# Patient Record
Sex: Male | Born: 1965 | Race: White | Hispanic: No | Smoking: Never smoker
Health system: Southern US, Community
[De-identification: ages and names within clinical notes are randomized; demographics above are authoritative.]

## PROBLEM LIST (undated history)

## (undated) DIAGNOSIS — G4730 Sleep apnea, unspecified: Secondary | ICD-10-CM

## (undated) DIAGNOSIS — E669 Obesity, unspecified: Secondary | ICD-10-CM

## (undated) DIAGNOSIS — I10 Essential (primary) hypertension: Secondary | ICD-10-CM

## (undated) HISTORY — DX: Essential (primary) hypertension: I10

## (undated) HISTORY — DX: Sleep apnea, unspecified: G47.30

## (undated) HISTORY — PX: VASECTOMY: SHX75

## (undated) HISTORY — DX: Obesity, unspecified: E66.9

---

## 2001-06-05 ENCOUNTER — Other Ambulatory Visit: Admission: RE | Admit: 2001-06-05 | Discharge: 2001-06-05 | Payer: Self-pay | Admitting: Urology

## 2004-03-06 ENCOUNTER — Ambulatory Visit (HOSPITAL_COMMUNITY): Admission: RE | Admit: 2004-03-06 | Discharge: 2004-03-07 | Payer: Self-pay | Admitting: Otolaryngology

## 2004-03-06 ENCOUNTER — Encounter (INDEPENDENT_AMBULATORY_CARE_PROVIDER_SITE_OTHER): Payer: Self-pay | Admitting: Specialist

## 2008-11-19 ENCOUNTER — Encounter: Admission: RE | Admit: 2008-11-19 | Discharge: 2008-11-19 | Payer: Self-pay | Admitting: Family Medicine

## 2010-12-11 NOTE — Op Note (Signed)
NAME:  CONNOR, MEACHAM NO.:  [id number]   MEDICAL RECORD NO.:  [id number]                   PATIENT TYPE:  OIB   LOCATION:  2112                                 FACILITY:  [facility]   PHYSICIAN:  Suzanna Obey, M.D.                    DATE OF BIRTH:  [birth year]   DATE OF PROCEDURE:  03/06/2004  DATE OF DISCHARGE:  03/07/2004                                 OPERATIVE REPORT   PREOPERATIVE DIAGNOSIS:  Obstructive sleep apnea, deviated septum, turbinate  hypertrophy.   POSTOPERATIVE DIAGNOSIS:  Obstructive sleep apnea, deviated septum,  turbinate hypertrophy.   OPERATION PERFORMED:  Septoplasty, submucous resection of inferior  turbinates, uvulopalatopharyngoplasty and tonsillectomy.   SURGEON:  Suzanna Obey, M.D.   ANESTHESIA:  General endotracheal tube.   ESTIMATED BLOOD LOSS:  Approximately 25 mL.   INDICATIONS FOR PROCEDURE:  The patient is a 45 year old who has had  problems with obstructive sleep apnea, has not been able to tolerate CPAP.  He has chronic nasal obstruction and deviated septum.  He also had chronic  tonsil problems.  He was informed of the risks and benefits of the procedure  including bleeding, infection, velopharyngeal insufficiency, change in the  voice, chronic crusting and drying, septal perforation, change in the  external appearance of the nose and the risks of the anesthetic.  All  questions were answered and consent was obtained.   DESCRIPTION OF PROCEDURE:  The patient was taken to the operating room and  placed in supine position.  After adequate general endotracheal tube  anesthesia, he was prepped and draped in the usual sterile manner.  The  oxymetazoline pledgets were placed into the nose bilaterally and then septum  and inferior turbinates were injected with 1%  lidocaine with 1:100,000  epinephrine.  The hemitransfixion incision on the right was made, raising  the mucoperichondrial and mucoperiosteal flap.  The  cartilage was divided  about 1.5 cm posterior to the caudal strut.  This cartilage was removed.  The posterior bone was removed with a Jansen-Middleton forceps.  The piece  of cartilage was then marsupialized and placed back in the flap and the flap  was closed with interrupted 4-0 chromic.  A 4-0 plain gut quilting stitch  was placed through the septum to secure the flaps as well as the cartilage.  The inferior turbinates were infractured.  A midline incision was made with  a 15 blade.  Mucosal flap elevated superiorly.  The inferior mucosa and bone  were removed with turbinate scissors.  The edge was cauterized with suction  cautery.  Both flaps were laid back down over the raw surface and turbinates  were outfractured.  Telfa rolls soaked in bacitracin placed in the nose  bilaterally and then secured with 3-0 nylon.  The Crowe-Davis mouth gag was  then inserted, retracted and suspended from the Mayo stand.  The operation  was begun making a left anterior tonsillar  pillar incision identifying the  capsule of the tonsil and removing it with electrocautery dissection.  The  dissection was carried across the palate right at the base of the uvula  crossing to the right tonsillar fossa where the tonsil was removed with  electrocautery dissection along its capsule.  The tonsils and uvula were  removed en bloc  and then the suction cautery was used to obtain hemostasis.  The palate and  tonsillar fossas were closed with interrupted 3-0 Vicryl.  There was good  hemostasis.  The hypopharynx, esophagus and stomach were suctioned with the  NG tube.  The patient was then awakened, brought to recovery in stable  condition. Counts correct.                                               Suzanna Obey, M.D.    Cordelia Pen  D:  04/02/2004  T:  04/02/2004  Job:  [id number]

## 2011-02-01 ENCOUNTER — Encounter: Payer: Self-pay | Admitting: Family Medicine

## 2011-02-01 DIAGNOSIS — I10 Essential (primary) hypertension: Secondary | ICD-10-CM | POA: Insufficient documentation

## 2011-02-01 DIAGNOSIS — G4730 Sleep apnea, unspecified: Secondary | ICD-10-CM | POA: Insufficient documentation

## 2011-02-01 DIAGNOSIS — E669 Obesity, unspecified: Secondary | ICD-10-CM | POA: Insufficient documentation

## 2014-12-27 DIAGNOSIS — N200 Calculus of kidney: Secondary | ICD-10-CM | POA: Diagnosis not present

## 2014-12-27 DIAGNOSIS — I10 Essential (primary) hypertension: Secondary | ICD-10-CM | POA: Insufficient documentation

## 2014-12-27 DIAGNOSIS — Z79899 Other long term (current) drug therapy: Secondary | ICD-10-CM | POA: Insufficient documentation

## 2014-12-27 DIAGNOSIS — R109 Unspecified abdominal pain: Secondary | ICD-10-CM | POA: Diagnosis present

## 2014-12-27 DIAGNOSIS — K4020 Bilateral inguinal hernia, without obstruction or gangrene, not specified as recurrent: Secondary | ICD-10-CM | POA: Diagnosis not present

## 2014-12-28 ENCOUNTER — Emergency Department
Admission: EM | Admit: 2014-12-28 | Discharge: 2014-12-28 | Disposition: A | Discharge status: Home / Self Care | Payer: BLUE CROSS/BLUE SHIELD | Attending: Emergency Medicine | Admitting: Emergency Medicine

## 2014-12-28 ENCOUNTER — Encounter: Payer: Self-pay | Admitting: Emergency Medicine

## 2014-12-28 ENCOUNTER — Emergency Department: Payer: BLUE CROSS/BLUE SHIELD

## 2014-12-28 DIAGNOSIS — N200 Calculus of kidney: Secondary | ICD-10-CM

## 2014-12-28 DIAGNOSIS — K4020 Bilateral inguinal hernia, without obstruction or gangrene, not specified as recurrent: Secondary | ICD-10-CM

## 2014-12-28 LAB — URINALYSIS COMPLETE WITH MICROSCOPIC (ARMC ONLY)
Bacteria, UA: NONE SEEN
Bilirubin Urine: NEGATIVE
GLUCOSE, UA: NEGATIVE mg/dL
KETONES UR: NEGATIVE mg/dL
Leukocytes, UA: NEGATIVE
NITRITE: NEGATIVE
Protein, ur: NEGATIVE mg/dL
Specific Gravity, Urine: 1.018 (ref 1.005–1.030)
pH: 6 (ref 5.0–8.0)

## 2014-12-28 LAB — CBC WITH DIFFERENTIAL/PLATELET
BASOS PCT: 1 %
Basophils Absolute: 0.1 10*3/uL (ref 0–0.1)
EOS ABS: 0.1 10*3/uL (ref 0–0.7)
EOS PCT: 1 %
HEMATOCRIT: 44.7 % (ref 40.0–52.0)
Hemoglobin: 14.5 g/dL (ref 13.0–18.0)
LYMPHS PCT: 20 %
Lymphs Abs: 1.9 10*3/uL (ref 1.0–3.6)
MCH: 29.3 pg (ref 26.0–34.0)
MCHC: 32.5 g/dL (ref 32.0–36.0)
MCV: 90.4 fL (ref 80.0–100.0)
MONOS PCT: 9 %
Monocytes Absolute: 0.8 10*3/uL (ref 0.2–1.0)
NEUTROS ABS: 6.4 10*3/uL (ref 1.4–6.5)
NEUTROS PCT: 69 %
PLATELETS: 297 10*3/uL (ref 150–440)
RBC: 4.94 MIL/uL (ref 4.40–5.90)
RDW: 13.6 % (ref 11.5–14.5)
WBC: 9.3 10*3/uL (ref 3.8–10.6)

## 2014-12-28 LAB — COMPREHENSIVE METABOLIC PANEL
ALBUMIN: 3.9 g/dL (ref 3.5–5.0)
ALK PHOS: 76 U/L (ref 38–126)
ALT: 26 U/L (ref 17–63)
AST: 25 U/L (ref 15–41)
Anion gap: 9 (ref 5–15)
BUN: 20 mg/dL (ref 6–20)
CO2: 25 mmol/L (ref 22–32)
CREATININE: 1.19 mg/dL (ref 0.61–1.24)
Calcium: 9.1 mg/dL (ref 8.9–10.3)
Chloride: 107 mmol/L (ref 101–111)
GFR calc non Af Amer: >60 mL/min (ref >60)
GLUCOSE: 155 mg/dL — AB (ref 65–99)
POTASSIUM: 3.7 mmol/L (ref 3.5–5.1)
Sodium: 141 mmol/L (ref 135–145)
Total Bilirubin: 0.6 mg/dL (ref 0.3–1.2)
Total Protein: 7.5 g/dL (ref 6.5–8.1)

## 2014-12-28 LAB — TROPONIN I: Troponin I: <0.03 ng/mL (ref <0.031)

## 2014-12-28 MED ORDER — IBUPROFEN 400 MG PO TABS
400.0000 mg | ORAL_TABLET | Freq: Once | ORAL | Status: AC
  Administered 2014-12-28: 400 mg via ORAL

## 2014-12-28 MED ORDER — IBUPROFEN 400 MG PO TABS
ORAL_TABLET | ORAL | Status: AC
  Filled 2014-12-28: qty 1

## 2014-12-28 MED ORDER — ONDANSETRON 8 MG PO TBDP
8.0000 mg | ORAL_TABLET | Freq: Once | ORAL | Status: AC
  Administered 2014-12-28: 8 mg via ORAL

## 2014-12-28 MED ORDER — OXYCODONE-ACETAMINOPHEN 5-325 MG PO TABS: 1.0000 | ORAL_TABLET | ORAL | Status: DC | PRN

## 2014-12-28 MED ORDER — MORPHINE SULFATE 4 MG/ML IJ SOLN
4.0000 mg | Freq: Once | INTRAMUSCULAR | Status: AC
  Administered 2014-12-28: 4 mg via INTRAVENOUS

## 2014-12-28 MED ORDER — MORPHINE SULFATE 4 MG/ML IJ SOLN
INTRAMUSCULAR | Status: AC
  Administered 2014-12-28: 4 mg via INTRAVENOUS
  Filled 2014-12-28: qty 1

## 2014-12-28 MED ORDER — ONDANSETRON HCL 4 MG/2ML IJ SOLN
INTRAMUSCULAR | Status: AC
  Administered 2014-12-28: 4 mg via INTRAVENOUS
  Filled 2014-12-28: qty 2

## 2014-12-28 MED ORDER — ONDANSETRON HCL 4 MG/2ML IJ SOLN
4.0000 mg | Freq: Once | INTRAMUSCULAR | Status: AC
  Administered 2014-12-28: 4 mg via INTRAVENOUS

## 2014-12-28 MED ORDER — ONDANSETRON 8 MG PO TBDP
ORAL_TABLET | ORAL | Status: AC
  Filled 2014-12-28: qty 1

## 2014-12-28 NOTE — ED Notes (Signed)
Patient with complaint of right flank pain, pain with urination, urinary frequency, vomiting and diarrhea.

## 2014-12-28 NOTE — Discharge Instructions (Signed)
Inguinal Hernia, Adult Muscles help keep everything in the body in its proper place. But if a weak spot in the muscles develops, something can poke through. That is called a hernia. When this happens in the lower part of the belly (abdomen), it is called an inguinal hernia. (It takes its name from a part of the body in this region called the inguinal canal.) A weak spot in the wall of muscles lets some fat or part of the small intestine bulge through. An inguinal hernia can develop at any age. Men get them more often than women. CAUSES  In adults, an inguinal hernia develops over time.  It can be triggered by:  Suddenly straining the muscles of the lower abdomen.  Lifting heavy objects.  Straining to have a bowel movement. Difficult bowel movements (constipation) can lead to this.  Constant coughing. This may be caused by smoking or lung disease.  Being overweight.  Being pregnant.  Working at a job that requires long periods of standing or heavy lifting.  Having had an inguinal hernia before. One type can be an emergency situation. It is called a strangulated inguinal hernia. It develops if part of the small intestine slips through the weak spot and cannot get back into the abdomen. The blood supply can be cut off. If that happens, part of the intestine may die. This situation requires emergency surgery. SYMPTOMS  Often, a small inguinal hernia has no symptoms. It is found when a healthcare provider does a physical exam. Larger hernias usually have symptoms.   In adults, symptoms may include:  A lump in the groin. This is easier to see when the person is standing. It might disappear when lying down.  In men, a lump in the scrotum.  Pain or burning in the groin. This occurs especially when lifting, straining or coughing.  A dull ache or feeling of pressure in the groin.  Signs of a strangulated hernia can include:  A bulge in the groin that becomes very painful and tender to the  touch.  A bulge that turns red or purple.  Fever, nausea and vomiting.  Inability to have a bowel movement or to pass gas. DIAGNOSIS  To decide if you have an inguinal hernia, a healthcare provider will probably do a physical examination.  This will include asking questions about any symptoms you have noticed.  The healthcare provider might feel the groin area and ask you to cough. If an inguinal hernia is felt, the healthcare provider may try to slide it back into the abdomen.  Usually no other tests are needed. TREATMENT  Treatments can vary. The size of the hernia makes a difference. Options include:  Watchful waiting. This is often suggested if the hernia is small and you have had no symptoms.  No medical procedure will be done unless symptoms develop.  You will need to watch closely for symptoms. If any occur, contact your healthcare provider right away.  Surgery. This is used if the hernia is larger or you have symptoms.  Open surgery. This is usually an outpatient procedure (you will not stay overnight in a hospital). An cut (incision) is made through the skin in the groin. The hernia is put back inside the abdomen. The weak area in the muscles is then repaired by herniorrhaphy or hernioplasty. Herniorrhaphy: in this type of surgery, the weak muscles are sewn back together. Hernioplasty: a patch or mesh is used to close the weak area in the abdominal wall.  Laparoscopy.  In this procedure, a surgeon makes small incisions. A thin tube with a tiny video camera (called a laparoscope) is put into the abdomen. The surgeon repairs the hernia with mesh by looking with the video camera and using two long instruments. HOME CARE INSTRUCTIONS   After surgery to repair an inguinal hernia:  You will need to take pain medicine prescribed by your healthcare provider. Follow all directions carefully.  You will need to take care of the wound from the incision.  Your activity will be  restricted for awhile. This will probably include no heavy lifting for several weeks. You also should not do anything too active for a few weeks. When you can return to work will depend on the type of job that you have.  During "watchful waiting" periods, you should:  Maintain a healthy weight.  Eat a diet high in fiber (fruits, vegetables and whole grains).  Drink plenty of fluids to avoid constipation. This means drinking enough water and other liquids to keep your urine clear or pale yellow.  Do not lift heavy objects.  Do not stand for long periods of time.  Quit smoking. This should keep you from developing a frequent cough. SEEK MEDICAL CARE IF:   A bulge develops in your groin area.  You feel pain, a burning sensation or pressure in the groin. This might be worse if you are lifting or straining.  You develop a fever of more than 100.5 F (38.1 C). SEEK IMMEDIATE MEDICAL CARE IF:   Pain in the groin increases suddenly.  A bulge in the groin gets bigger suddenly and does not go down.  For men, there is sudden pain in the scrotum. Or, the size of the scrotum increases.  A bulge in the groin area becomes red or purple and is painful to touch.  You have nausea or vomiting that does not go away.  You feel your heart beating much faster than normal.  You cannot have a bowel movement or pass gas.  You develop a fever of more than 102.0 F (38.9 C). Document Released: 11/28/2008 Document Revised: 10/04/2011 Document Reviewed: 11/28/2008 [facility] Patient Information 2015 [place], [place]. This information is not intended to replace advice given to you by your health care provider. Make sure you discuss any questions you have with your health care provider.  Kidney Stones Kidney stones (urolithiasis) are deposits that form inside your kidneys. The intense pain is caused by the stone moving through the urinary tract. When the stone moves, the ureter goes into spasm around the  stone. The stone is usually passed in the urine.  CAUSES   A disorder that makes certain neck glands produce too much parathyroid hormone (primary hyperparathyroidism).  A buildup of uric acid crystals, similar to gout in your joints.  Narrowing (stricture) of the ureter.  A kidney obstruction present at birth (congenital obstruction).  Previous surgery on the kidney or ureters.  Numerous kidney infections. SYMPTOMS   Feeling sick to your stomach (nauseous).  Throwing up (vomiting).  Blood in the urine (hematuria).  Pain that usually spreads (radiates) to the groin.  Frequency or urgency of urination. DIAGNOSIS   Taking a history and physical exam.  Blood or urine tests.  CT scan.  Occasionally, an examination of the inside of the urinary bladder (cystoscopy) is performed. TREATMENT   Observation.  Increasing your fluid intake.  Extracorporeal shock wave lithotripsy--This is a noninvasive procedure that uses shock waves to break up kidney stones.  Surgery may  be needed if you have severe pain or persistent obstruction. There are various surgical procedures. Most of the procedures are performed with the use of small instruments. Only small incisions are needed to accommodate these instruments, so recovery time is minimized. The size, location, and chemical composition are all important variables that will determine the proper choice of action for you. Talk to your health care provider to better understand your situation so that you will minimize the risk of injury to yourself and your kidney.  HOME CARE INSTRUCTIONS   Drink enough water and fluids to keep your urine clear or pale yellow. This will help you to pass the stone or stone fragments.  Strain all urine through the provided strainer. Keep all particulate matter and stones for your health care provider to see. The stone causing the pain may be as small as a grain of salt. It is very important to use the strainer  each and every time you pass your urine. The collection of your stone will allow your health care provider to analyze it and verify that a stone has actually passed. The stone analysis will often identify what you can do to reduce the incidence of recurrences.  Only take over-the-counter or prescription medicines for pain, discomfort, or fever as directed by your health care provider.  Make a follow-up appointment with your health care provider as directed.  Get follow-up X-rays if required. The absence of pain does not always mean that the stone has passed. It may have only stopped moving. If the urine remains completely obstructed, it can cause loss of kidney function or even complete destruction of the kidney. It is your responsibility to make sure X-rays and follow-ups are completed. Ultrasounds of the kidney can show blockages and the status of the kidney. Ultrasounds are not associated with any radiation and can be performed easily in a matter of minutes. SEEK MEDICAL CARE IF:  You experience pain that is progressive and unresponsive to any pain medicine you have been prescribed. SEEK IMMEDIATE MEDICAL CARE IF:   Pain cannot be controlled with the prescribed medicine.  You have a fever or shaking chills.  The severity or intensity of pain increases over 18 hours and is not relieved by pain medicine.  You develop a new onset of abdominal pain.  You feel faint or pass out.  You are unable to urinate. MAKE SURE YOU:   Understand these instructions.  Will watch your condition.  Will get help right away if you are not doing well or get worse. Document Released: 07/12/2005 Document Revised: 03/14/2013 Document Reviewed: 12/13/2012 [facility] Patient Information 2015 [place], [place]. This information is not intended to replace advice given to you by your health care provider. Make sure you discuss any questions you have with your health care provider.

## 2014-12-28 NOTE — ED Provider Notes (Signed)
[facility] Emergency [facility] Provider Note  ____________________________________________  Time seen: 4:00 AM  I have reviewed the triage vital signs and the nursing notes.   HISTORY  Chief Complaint Flank Pain      HPI Rodney Rios is a 49 y.o. male presents with acute onset of right flank pain times one day.Current pain score 10 out of 10 described as sharp. Patient also admits to urinary frequency and vomiting. Patient denies any fever no diarrhea    Past Medical History  Diagnosis Date  . Sleep apnea   . Obesity   . Hypertension     Patient Active Problem List   Diagnosis Date Noted  . Sleep apnea   . Obesity   . Hypertension     Past Surgical History  Procedure Laterality Date  . Vasectomy      Current Outpatient Rx  Name  Route  Sig  Dispense  Refill  . benazepril-hydrochlorthiazide (LOTENSIN HCT) 10-12.5 MG per tablet   Oral   Take 1 tablet by mouth daily.             Allergies Review of patient's allergies indicates no known allergies.  History reviewed. No pertinent family history.  Social History History  Substance Use Topics  . Smoking status: Never Smoker   . Smokeless tobacco: Not on file  . Alcohol Use: No    Review of Systems  Constitutional: Negative for fever. Eyes: Negative for visual changes. ENT: Negative for sore throat. Cardiovascular: Negative for chest pain. Respiratory: Negative for shortness of breath. Gastrointestinal: Positive right flank pain vomiting. Genitourinary: Negative for dysuria. Musculoskeletal: Negative for back pain. Skin: Negative for rash. Neurological: Negative for headaches, focal weakness or numbness.   10-point ROS otherwise negative.  ____________________________________________   PHYSICAL EXAM:  VITAL SIGNS: ED Triage Vitals  Enc Vitals Group     BP 12/28/14 0030 156/81 mmHg     Pulse Rate 12/28/14 0030 62     Resp 12/28/14 0030 22     Temp  12/28/14 0030 98.1 F (36.7 C)     Temp Source 12/28/14 0030 Oral     SpO2 12/28/14 0030 97 %     Weight 12/28/14 0030 375 lb (170.099 kg)     Height 12/28/14 0030 5\' 9"  (1.753 m)     Head Cir --      Peak Flow --      Pain Score 12/28/14 0030 9     Pain Loc --      Pain Edu? --      Excl. in GC? --     Constitutional: Alert and oriented. Apparent distress Eyes: Conjunctivae are normal. PERRL. Normal extraocular movements. ENT   Head: Normocephalic and atraumatic.   Nose: No congestion/rhinnorhea.   Mouth/Throat: Mucous membranes are moist.   Neck: No stridor. Cardiovascular: Normal rate, regular rhythm. Normal and symmetric distal pulses are present in all extremities. No murmurs, rubs, or gallops. Respiratory: Normal respiratory effort without tachypnea nor retractions. Breath sounds are clear and equal bilaterally. No wheezes/rales/rhonchi. Gastrointestinal: Soft and nontender. No distention. There is no CVA tenderness. Genitourinary: deferred Musculoskeletal: Nontender with normal range of motion in all extremities. No joint effusions.  No lower extremity tenderness nor edema. Neurologic:  Normal speech and language. No gross focal neurologic deficits are appreciated. Speech is normal.  Skin:  Skin is warm, dry and intact. No rash noted. Psychiatric: Mood and affect are normal. Speech and behavior are normal. Patient exhibits appropriate insight and  judgment.  ____________________________________________    LABS (pertinent positives/negatives)  Labs Reviewed  COMPREHENSIVE METABOLIC PANEL - Abnormal; Notable for the following:    Glucose, Bld 155 (*)    All other components within normal limits  URINALYSIS COMPLETEWITH MICROSCOPIC ([facility] ONLY) - Abnormal; Notable for the following:    Color, Urine YELLOW (*)    APPearance CLEAR (*)    Hgb urine dipstick 2+ (*)    Squamous Epithelial / LPF 0-5 (*)    All other components within normal limits  CBC WITH  DIFFERENTIAL/PLATELET  TROPONIN I     ____________________________________________      RADIOLOGY  CT abdomen and pelvis revealed bilateral inguinal hernias containing fat.  ____________________________________________      INITIAL IMPRESSION / ASSESSMENT AND PLAN / ED COURSE  Pertinent labs & imaging results that were available during my care of the patient were reviewed by me and considered in my medical decision making (see chart for details).  History of physical exam consistent with possible ureterolithiasis given hematuria and questionable area on the CT for ureterolithiasis. Patient received IV morphine 2 mg and Zofran emergency department.  ____________________________________________   FINAL CLINICAL IMPRESSION(S) / ED DIAGNOSES  Final diagnoses:  Bilateral inguinal hernia without obstruction or gangrene, recurrence not specified  Kidney stone      Darci Current, MD 12/28/14 [phone]

## 2016-02-25 ENCOUNTER — Ambulatory Visit (INDEPENDENT_AMBULATORY_CARE_PROVIDER_SITE_OTHER): Payer: BLUE CROSS/BLUE SHIELD | Admitting: Neurology

## 2016-02-25 ENCOUNTER — Encounter: Payer: Self-pay | Admitting: Neurology

## 2016-02-25 VITALS — BP 168/96 | HR 88 | Resp 20 | Ht 69.0 in | Wt 396.0 lb

## 2016-02-25 DIAGNOSIS — Z9889 Other specified postprocedural states: Secondary | ICD-10-CM

## 2016-02-25 DIAGNOSIS — G4726 Circadian rhythm sleep disorder, shift work type: Secondary | ICD-10-CM | POA: Diagnosis not present

## 2016-02-25 DIAGNOSIS — E669 Obesity, unspecified: Secondary | ICD-10-CM | POA: Insufficient documentation

## 2016-02-25 DIAGNOSIS — Z9989 Dependence on other enabling machines and devices: Secondary | ICD-10-CM

## 2016-02-25 DIAGNOSIS — G4733 Obstructive sleep apnea (adult) (pediatric): Secondary | ICD-10-CM | POA: Diagnosis not present

## 2016-02-25 DIAGNOSIS — R0683 Snoring: Secondary | ICD-10-CM

## 2016-02-25 DIAGNOSIS — E662 Morbid (severe) obesity with alveolar hypoventilation: Secondary | ICD-10-CM | POA: Diagnosis not present

## 2016-02-25 NOTE — Patient Instructions (Signed)
Bariatric Surgery, Care After  Refer to this sheet in the next few weeks. These instructions provide you with information on caring for yourself after your procedure. Your health care provider may also give you more specific instructions. Your treatment has been planned according to current medical practices, but problems sometimes occur. Call your health care provider if you have any problems or questions after your procedure.  WHAT TO EXPECT AFTER THE PROCEDURE  After your procedure, it is typical to have the following sensations:  · Soreness.  · Sluggishness.  · Tiredness.  · Moodiness.  · Chilliness.  It is not unusual to have dry skin and some hair loss after bariatric surgery.  HOME CARE INSTRUCTIONS  · Do not drink alcohol, use public transportation, or sign important papers for at least 1 day following surgery.  · Do not resume physical activities or drive until directed by your surgeon.  · Avoid lifting anything over 10 pounds (4.5 kg) for 6 weeks following surgery, or until approved by your surgeon.    · Only take over-the-counter or prescription medicines for pain, discomfort, or fever as directed by your surgeon.    · Resume your diet as directed by your surgeon.  You will resume eating with liquids and pureed foods, then soft foods, and progress to a more normal diet over time. Follow your surgeon or dietitian's guidelines for what and how much to eat and drink. You will need to eat slowly, so as not to cause discomfort and vomiting.  · Use showers for bathing as directed by your surgeon.    · Change dressings as directed by your surgeon.  · Schedule a follow-up appointment with your surgeon as directed.  SEEK MEDICAL CARE IF:   · There is redness, swelling, or increasing pain in the wound.    · There is pus coming from the wound.    · There is drainage from the wound lasting longer than 1 day.    · An unexplained oral temperature above 102°F (38.9°C) develops.    · You notice a foul smell coming from  the wound or dressing.    · There is a breaking open of the wound (edges not staying together) after stitches have been removed.    · You notice increasing pain in the shoulder-strap areas.    · You develop episodes of dizziness or faint while standing.    · You develop shortness of breath.    · You develop persistent nausea or vomiting.    · Your soreness seems to be getting worse rather than better.    SEEK IMMEDIATE MEDICAL CARE IF:   · You develop a rash.    · You have difficulty breathing.    · You develop, or feel you are developing, any reaction or side effects to medicines.  FOR MORE INFORMATION  American Society for Bariatric Surgery: www.asbs.org  Weight-control Information Network (WIN): win.niddk.nih.gov     This information is not intended to replace advice given to you by your health care provider. Make sure you discuss any questions you have with your health care provider.     Document Released: 07/12/2005 Document Revised: 08/02/2014 Document Reviewed: 01/10/2013  Elsevier Interactive Patient Education ©2016 [employer]

## 2016-02-25 NOTE — Progress Notes (Signed)
[facility]   Provider:  Melvyn Novas, M D  Referring Provider: Primary Care Physician:  Rodney Sax, MD  Chief Complaint  Patient presents with  . New Patient (Initial Visit)    has had sleep study before, on cpap that he has borrowed    HPI:  Rodney Rios is a 50 y.o. male , seen here as a referral from Dr.  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 The patient also struggles with weight. He is a [employer] driver and was many years ago already diagnosed with sleep apnea, and was issued a CPAP machine. A repeat sleep study in 2006 confirmed the apnea and CPAP in form of a new machine was initiated this machine broke about 18 months ago and he is urgently needing a reassessment and treatment. On the DOT problem list there is further mention of bronchitis which may be by now a remote problem lower extremity edema. He was worked up for a possible deep venous thrombosis in his right lower extremity but this was not confirmed. He is super -obese.  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    Sleep habits are as follows: He is constantly working nights, no shift changes. Usually 12 hour shifts, 5 days a week,  60 hours/ week -Monday night , through Friday nights.  He usually can leave work around 5 AM and will arrive home after a 45 minute ride. He usually eats on the way home, take out food. He describes his bedroom is cool, quiet and dark. He is usually able to fall asleep by about 7.30 AM and uses a Tempurepedic bed, which elevates his legs. Sleeps supine and on his side , uses several pillows- supporting his knees , one for the head. He elevates the head of bed.  He does have knee  arthritis, which is painful. He lives in his mothers house, is divorced and has two children 26 and 33 years old. He usually sleeps until 6:54 PM,he can sleep through and he will have only 1 bathroom break or none. He sometimes has woken up with a dry mouth while using CPAP, recently he has not had a dry mouth and she does not have headaches, palpitations or diaphoresis.  Sleep medical history and family sleep history:  CPAP user since 1999, after he got married.  His wife told him he snored. Underwent tonsillectomy, and later a full UPPP in 2006 , had adenoid ectomy,  and turbinate reduction.  Mother uses CPAP. Father has DM and had a CVA.  Social history:  divorced , caffeine ; 40 ounces Sodas, 1 glass of tea. The patient has no history of tobacco use and is currently not using any tobacco products. He drinks very rarely alcohol.  Review of Systems: Out of a complete 14 system review, the patient complains of only the following symptoms, and all other reviewed systems are negative. Snoring loudly when without CPAP, leg swelling. Fatigue, weight gain.  Epworth score  7, Fatigue severity score 32   , depression score 2/15    Social History   Social History  . Marital status: Legally Separated    Spouse  name: N/A  . Number of children: N/A  . Years of education: N/A   Occupational History  . Not on file.   Social History Main Topics  . Smoking status: Never Smoker  . Smokeless tobacco: Never Used  . Alcohol use Yes     Comment: rare  . Drug use: No  . Sexual activity: Not on file   Other Topics Concern  . Not on file   Social History Narrative  . No narrative on file    Family History  Problem Relation Age of Onset  . Diabetes Father   . Stroke Father     Past Medical History:  Diagnosis Date  . Hypertension   . Obesity   . Sleep apnea     Past Surgical History:  Procedure Laterality Date  . VASECTOMY      No current outpatient prescriptions on file.   No  current facility-administered medications for this visit.     Allergies as of 02/25/2016  . (No Known Allergies)    Vitals: BP (!) 168/96   Pulse 88   Resp 20   Ht 5\' 9"  (1.753 m)   Wt (!) 396 lb (179.6 kg)   BMI 58.48 kg/m  Last Weight:  Wt Readings from Last 1 Encounters:  02/25/16 (!) 396 lb (179.6 kg)   ZOX:WRUE mass index is 58.48 kg/m.     Last Height:   Ht Readings from Last 1 Encounters:  02/25/16 5\' 9"  (1.753 m)    Physical exam:  General: The patient is awake, alert and appears not in acute distress. The patient is well groomed. Head: Normocephalic, atraumatic. Neck is supple. Mallampati status post UPPP-  irregular dentition.    neck circumference:21. Nasal airflow patent , TMJ click not Evident. Retrognathia is not seen.  Cardiovascular:  Regular rate and rhythm, without  murmurs or carotid bruit, and without distended neck veins. Respiratory: Lungs are clear to auscultation. Skin:  Without evidence of edema, or rash Trunk: BMI is super obese . The patient's posture is erect   Neurologic exam : The patient is awake and alert, oriented to place and time.   Attention span & concentration ability appears normal.  Speech is fluent,  without  dysarthria, dysphonia or aphasia.  Mood and affect are appropriate.  Cranial nerves: Pupils are equal and briskly reactive to light. Funduscopic exam without evidence of pallor or edema.  Extraocular movements  in vertical and horizontal planes intact and without nystagmus. Visual fields by finger perimetry are intact. Hearing to finger rub intact.  Facial sensation intact to fine touch. Facial motor strength is symmetric and tongue and uvula move midline. Shoulder shrug was symmetrical.   Motor exam:   Normal tone, muscle bulk and symmetric strength in all extremities.  Sensory:  Fine touch, pinprick and vibration were tested in all extremities. Proprioception tested in the upper extremities was normal.  Coordination:  Rapid alternating movements in the fingers/hands was normal. Finger-to-nose maneuver normal without evidence of ataxia, dysmetria or tremor.  Gait and station: Patient walks without assistive device and is able unassisted to climb up to the exam table. Strength within normal limits.  Stance is stable and normal.  Deep tendon reflexes: in the  upper and lower extremities are symmetric and intact. Babinski maneuver response is  downgoing.  The patient was advised of the nature of the diagnosed sleep disorder , the treatment options and risks for general a health and wellness arising from not treating the condition.  I spent more than  35  minutes of face to face time with the patient. Greater than 50% of time was spent in counseling and coordination of care. We have discussed the diagnosis and differential and I answered the patient's questions.     Assessment:  After physical and neurologic examination, review of laboratory studies,  Personal review of imaging studies, reports of other /same  Imaging studies ,  Results of polysomnography/ neurophysiology testing and pre-existing records as far as provided in visit., my assessment is   1) Mr. 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  Since his last machine broke he has been using a Development worker, community. He urgently needs his own machine set to his needs and with wife I accessible database. Therapeutic data will be obtained for the DOT after 90 days of use and yearly after. I am hesitant to prescribe an auto titrate her for a patient who had undergone a UPPP. The American Academy of sleep medicine recommends against it. I will therefore invite the patient for a daytime sleep study that'll suit his shift work routine. Goal is to document the degree of apnea, which I have no doubt will be confirmed to be present, followed  by a titration. I will have Mr. Sharmon Revere schedule the Sleep study ASAP with my staff today.   Plan:  Treatment plan and additional workup :  SPLIT night with capnography.  Shift work adjusted test time in day time.  Get patient a wedge for the bed.  ASAP , daytime DOT required study .      Porfirio Mylar Annai Heick MD  02/25/2016   CC: Rodney Sax, MD

## 2016-05-27 IMAGING — CT CT ABD-PELV W/O CM
1 of 2 series · 15 of 32 positions shown, 19 images · non-contrast
Comparison: None.

CLINICAL DATA: Acute onset of right flank pain. Dysuria and
increased urinary frequency. Vomiting and diarrhea. Initial
encounter.

EXAM:
CT ABDOMEN AND PELVIS WITHOUT CONTRAST
TECHNIQUE: Multidetector CT imaging of the abdomen and pelvis was performed
following the standard protocol without IV contrast.

[Series 3: routine abd pel without · axial · non-contrast · 0.98mm/px · z∈[-593,-108]mm · 15 of 107 slices shown, 19 images]
[im 5/107  soft-tissue]
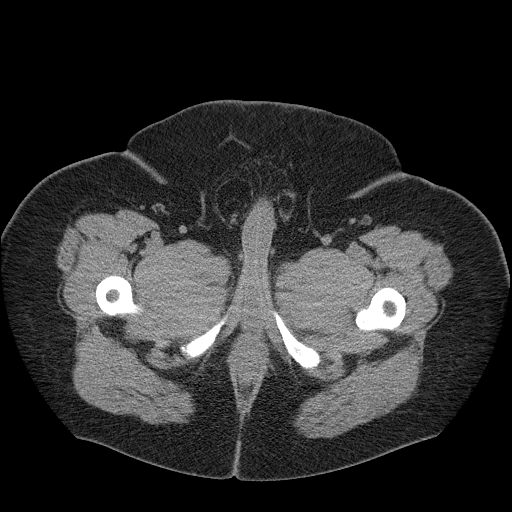
[im 5/107  bone]
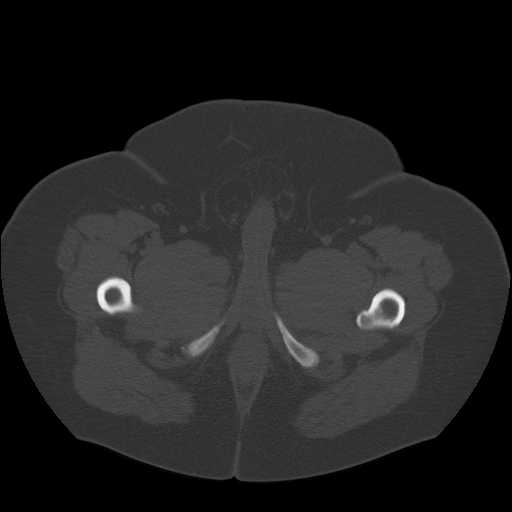
[im 14/107  soft-tissue]
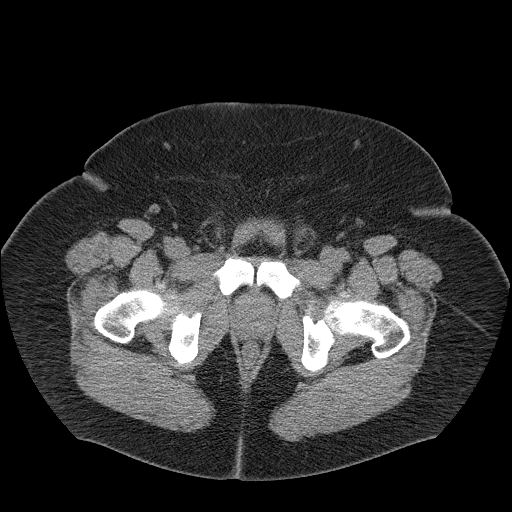
[im 24/107  soft-tissue]
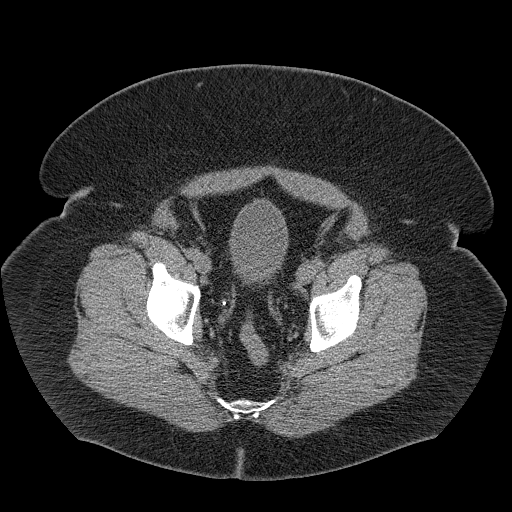
[im 28/107  soft-tissue]
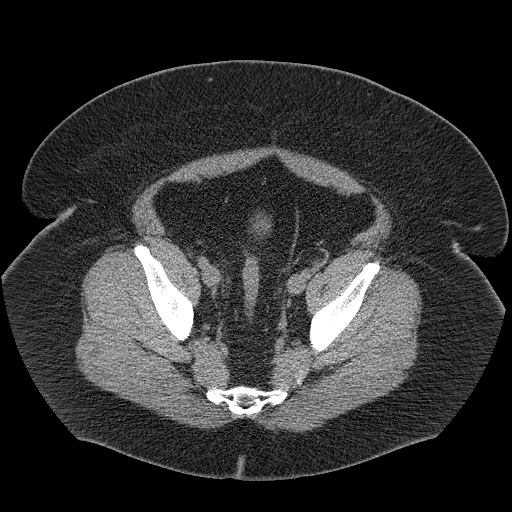
[im 37/107  soft-tissue]
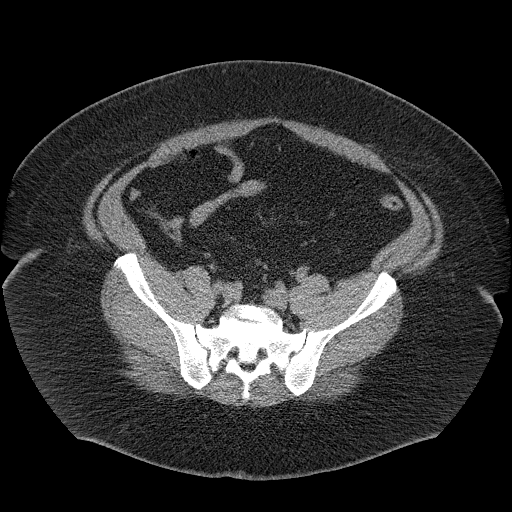
[im 47/107  soft-tissue]
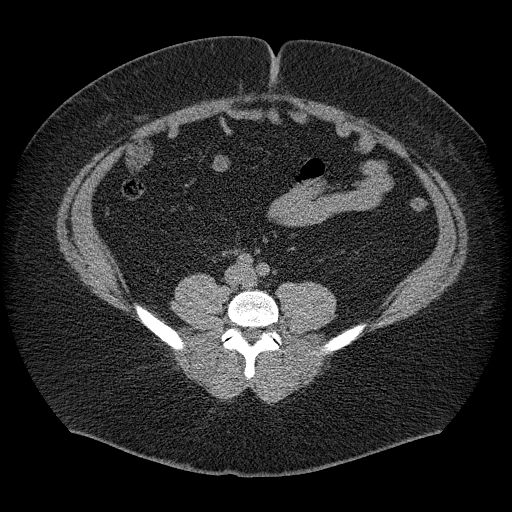
[im 56/107  soft-tissue]
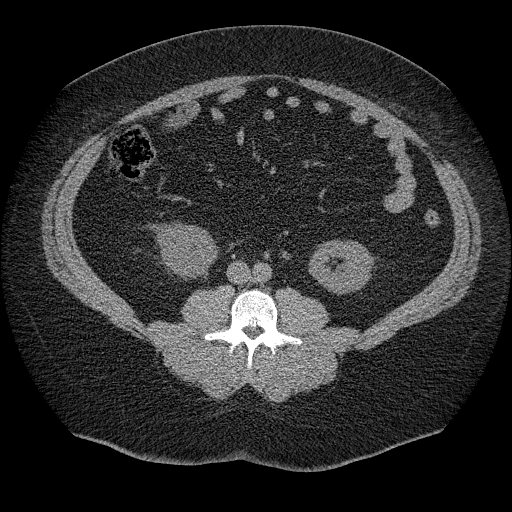
[im 60/107  soft-tissue]
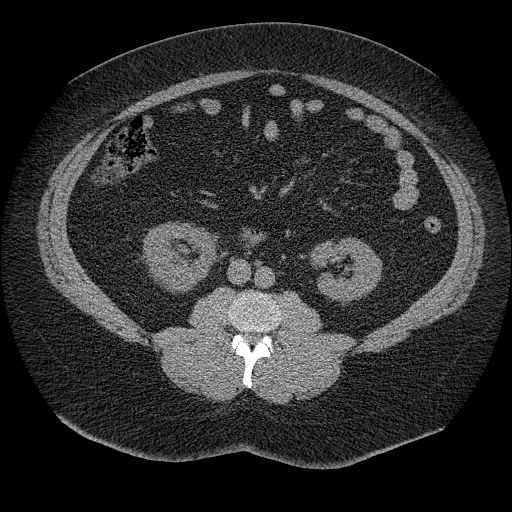
[im 70/107  soft-tissue]
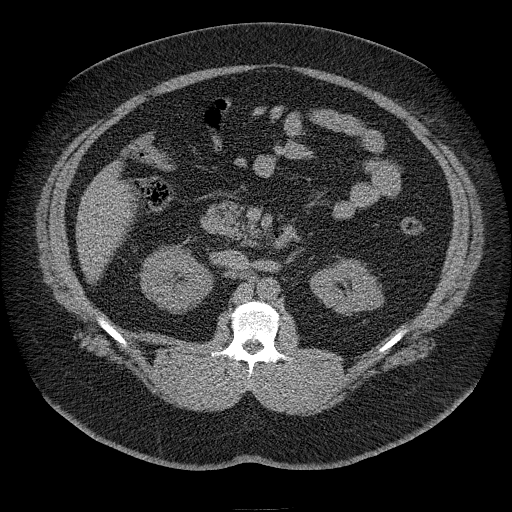
[im 70/107  bone]
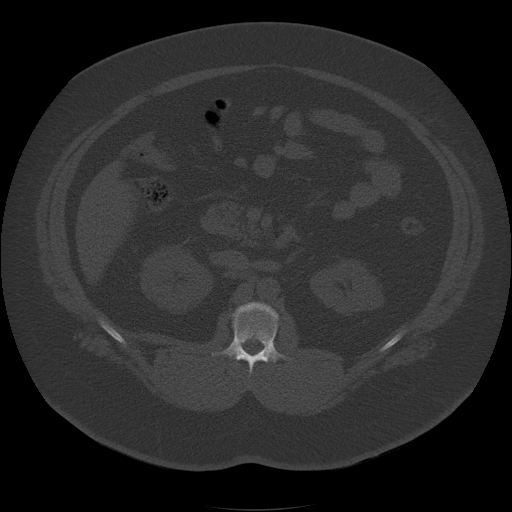
[im 79/107  soft-tissue]
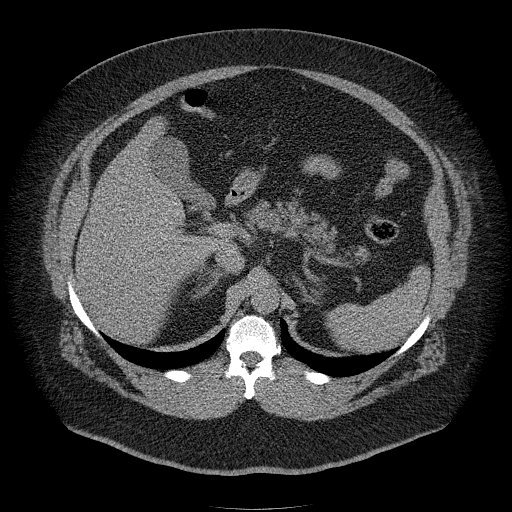
[im 83/107  soft-tissue]
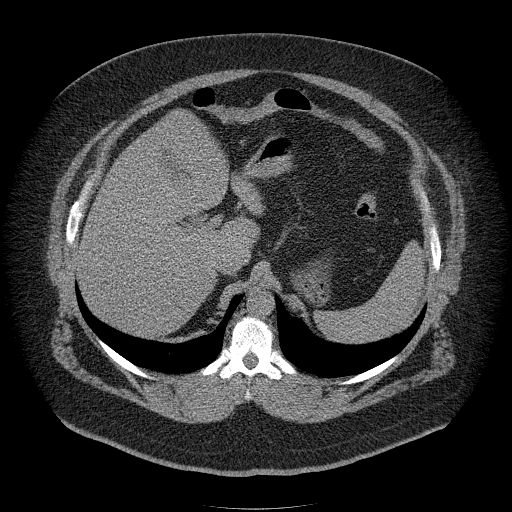
[im 88/107  lung]
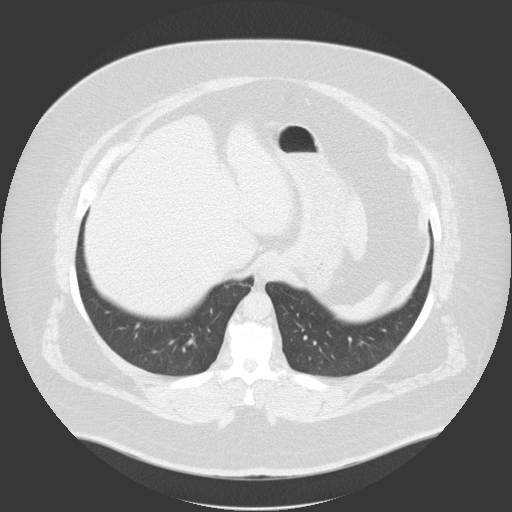
[im 93/107  soft-tissue]
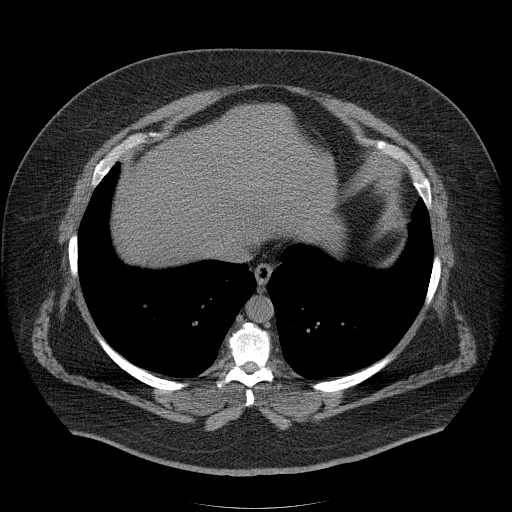
[im 93/107  lung]
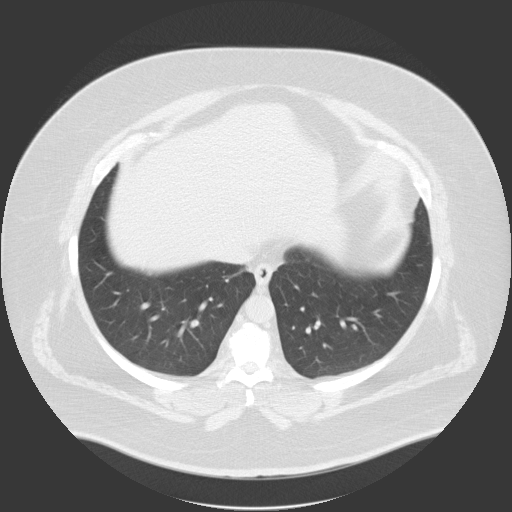
[im 97/107  lung]
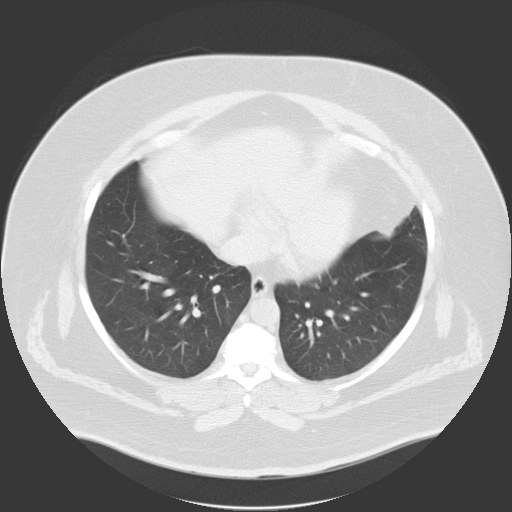
[im 102/107  soft-tissue]
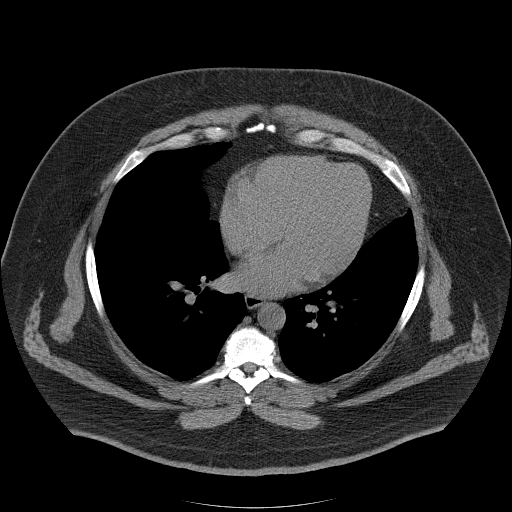
[im 102/107  lung]
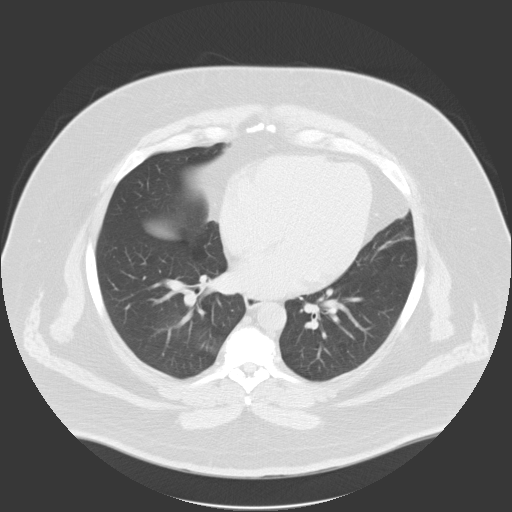

[15 of 32 positions shown; findings below may reference images not displayed]

FINDINGS: The visualized lung bases are clear.

The liver and spleen are unremarkable in appearance. The gallbladder
is within normal limits. The pancreas and adrenal glands are
unremarkable.

Nonspecific perinephric stranding is noted bilaterally. The kidneys
are otherwise unremarkable. There is no evidence of hydronephrosis.
No renal or ureteral stones are seen.

No free fluid is identified. The small bowel is unremarkable in
appearance. The stomach is within normal limits. No acute vascular
abnormalities are seen.

The appendix is normal in caliber, without evidence of appendicitis.
The colon is unremarkable in appearance.

The bladder is mildly distended and grossly unremarkable. The
prostate remains normal in size. No inguinal lymphadenopathy is
seen. Small bilateral inguinal hernias are seen, right larger than
left, containing only fat.

No acute osseous abnormalities are identified. Mild vacuum
phenomenon and disc space narrowing are noted at L5-S1; this is
often a normal finding.
IMPRESSION: 1. No acute abnormality seen within the abdomen or pelvis.
2. Small bilateral inguinal hernias, right larger than left,
containing only fat.
